# Patient Record
Sex: Male | Born: 1975 | Race: White | Hispanic: No | Smoking: Former smoker
Health system: Southern US, Community
[De-identification: ages and names within clinical notes are randomized; demographics above are authoritative.]

---

## 2002-06-14 ENCOUNTER — Emergency Department (HOSPITAL_COMMUNITY): Admission: EM | Admit: 2002-06-14 | Discharge: 2002-06-14 | Payer: Self-pay | Admitting: Emergency Medicine

## 2005-06-22 ENCOUNTER — Emergency Department (HOSPITAL_COMMUNITY): Admission: EM | Admit: 2005-06-22 | Discharge: 2005-06-23 | Payer: Self-pay | Admitting: Emergency Medicine

## 2006-12-19 ENCOUNTER — Emergency Department (HOSPITAL_COMMUNITY): Admission: EM | Admit: 2006-12-19 | Discharge: 2006-12-19 | Payer: Self-pay | Admitting: Emergency Medicine

## 2007-11-11 ENCOUNTER — Emergency Department (HOSPITAL_COMMUNITY): Admission: EM | Admit: 2007-11-11 | Discharge: 2007-11-11 | Payer: Self-pay | Admitting: Emergency Medicine

## 2011-11-26 ENCOUNTER — Emergency Department (HOSPITAL_COMMUNITY): Payer: BC Managed Care – PPO

## 2011-11-26 ENCOUNTER — Other Ambulatory Visit: Payer: Self-pay

## 2011-11-26 ENCOUNTER — Emergency Department (HOSPITAL_COMMUNITY)
Admission: EM | Admit: 2011-11-26 | Discharge: 2011-11-26 | Disposition: A | Discharge status: Home / Self Care | Payer: BC Managed Care – PPO | Attending: Emergency Medicine | Admitting: Emergency Medicine

## 2011-11-26 ENCOUNTER — Encounter (HOSPITAL_COMMUNITY): Payer: Self-pay | Admitting: *Deleted

## 2011-11-26 DIAGNOSIS — K859 Acute pancreatitis without necrosis or infection, unspecified: Secondary | ICD-10-CM | POA: Insufficient documentation

## 2011-11-26 DIAGNOSIS — R109 Unspecified abdominal pain: Secondary | ICD-10-CM | POA: Insufficient documentation

## 2011-11-26 DIAGNOSIS — Z79899 Other long term (current) drug therapy: Secondary | ICD-10-CM | POA: Insufficient documentation

## 2011-11-26 DIAGNOSIS — R10819 Abdominal tenderness, unspecified site: Secondary | ICD-10-CM | POA: Insufficient documentation

## 2011-11-26 DIAGNOSIS — R079 Chest pain, unspecified: Secondary | ICD-10-CM | POA: Insufficient documentation

## 2011-11-26 LAB — COMPREHENSIVE METABOLIC PANEL
ALT: 26 U/L (ref 0–53)
Alkaline Phosphatase: 77 U/L (ref 39–117)
CO2: 25 mEq/L (ref 19–32)
GFR calc Af Amer: >90 mL/min (ref >90)
GFR calc non Af Amer: >90 mL/min (ref >90)
Glucose, Bld: 69 mg/dL — ABNORMAL LOW (ref 70–99)
Potassium: 4 mEq/L (ref 3.5–5.1)
Sodium: 138 mEq/L (ref 135–145)

## 2011-11-26 LAB — DIFFERENTIAL
Lymphocytes Relative: 34 % (ref 12–46)
Lymphs Abs: 2.9 10*3/uL (ref 0.7–4.0)
Neutrophils Relative %: 54 % (ref 43–77)

## 2011-11-26 LAB — CBC
MCV: 81.9 fL (ref 78.0–100.0)
Platelets: 261 10*3/uL (ref 150–400)
RBC: 5.64 MIL/uL (ref 4.22–5.81)
WBC: 8.5 10*3/uL (ref 4.0–10.5)

## 2011-11-26 IMAGING — CT CT ABD-PELV W/ CM
2 of 4 series · 17 of 46 positions shown, 19 images · IV contrast (APPLIED)
Comparison: None.

CLINICAL DATA: Epigastric pain for 2 days

CT ABDOMEN AND PELVIS WITH CONTRAST
TECHNIQUE: Multidetector CT imaging of the abdomen and pelvis was
performed following the standard protocol during bolus
administration of intravenous contrast.
Contrast: 100mL OMNIPAQUE IOHEXOL 300 MG/ML  SOLN

[Series 2: abd/pelv with 5.0 b31f st · axial · 0.70mm/px · z∈[-512,-6]mm · 14 of 111 slices shown, 16 images]
[im 5/111  soft-tissue]
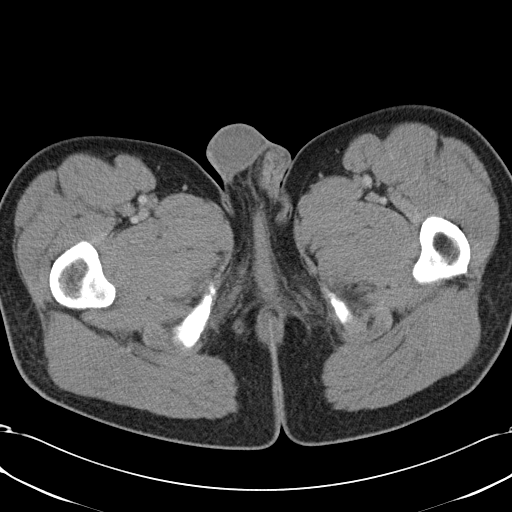
[im 5/111  bone]
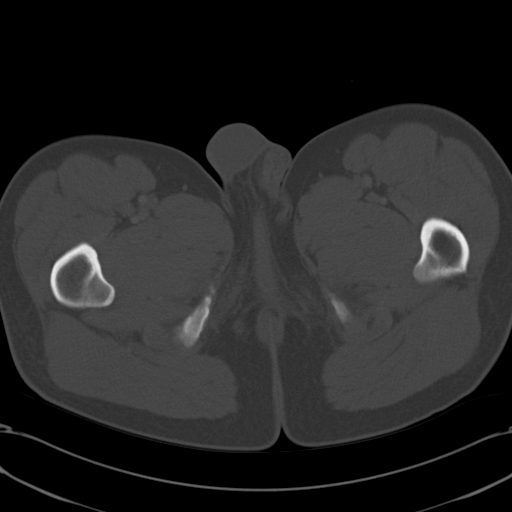
[im 13/111  soft-tissue]
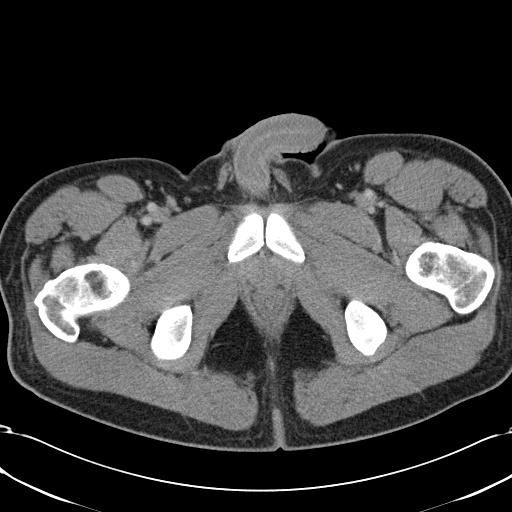
[im 22/111  soft-tissue]
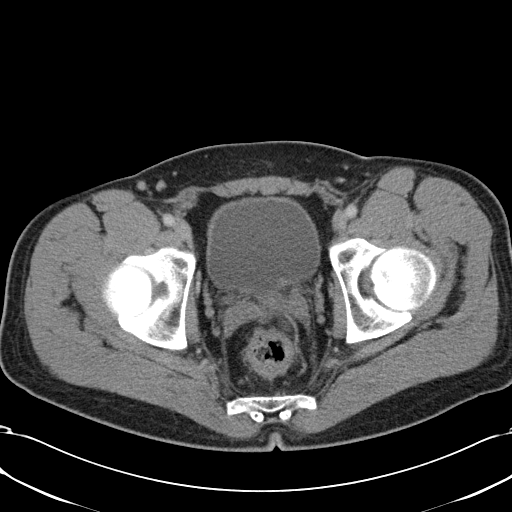
[im 30/111  soft-tissue]
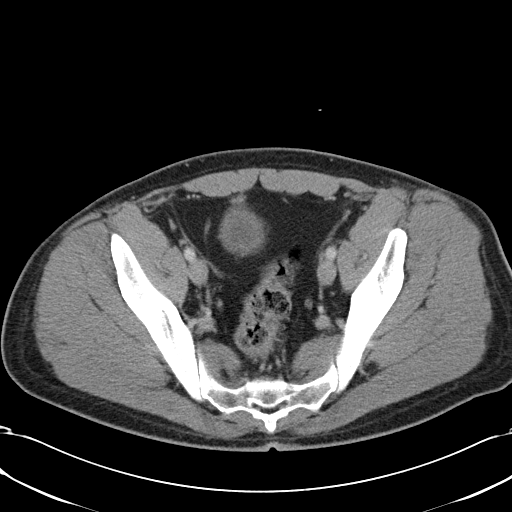
[im 39/111  soft-tissue]
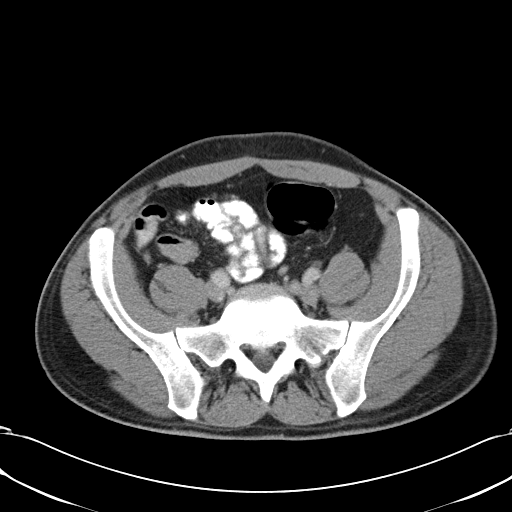
[im 43/111  soft-tissue]
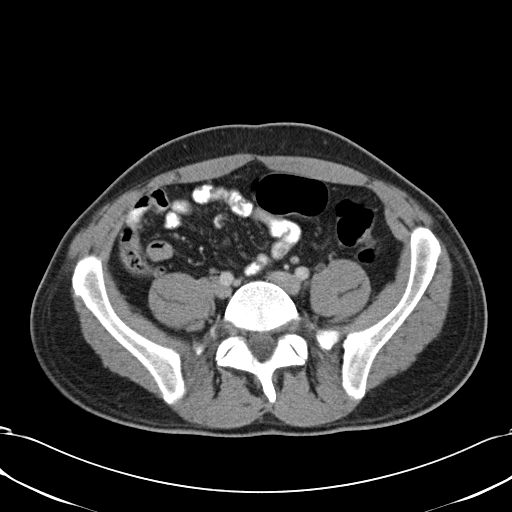
[im 51/111  soft-tissue]
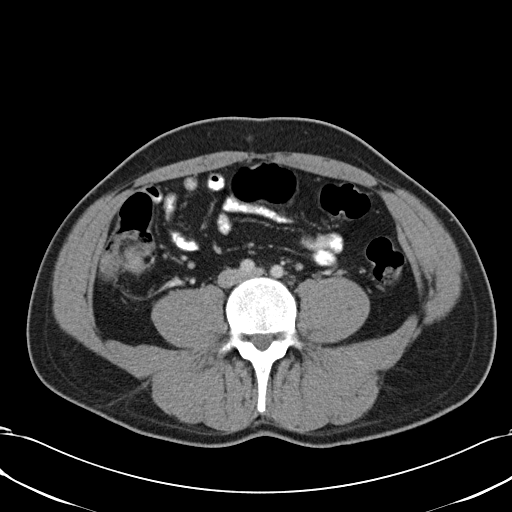
[im 60/111  soft-tissue]
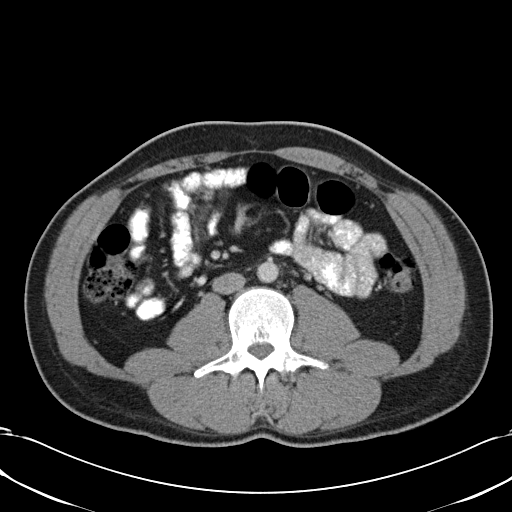
[im 68/111  soft-tissue]
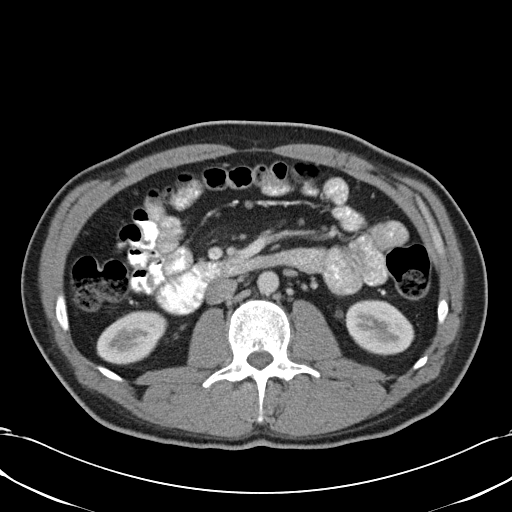
[im 68/111  bone]
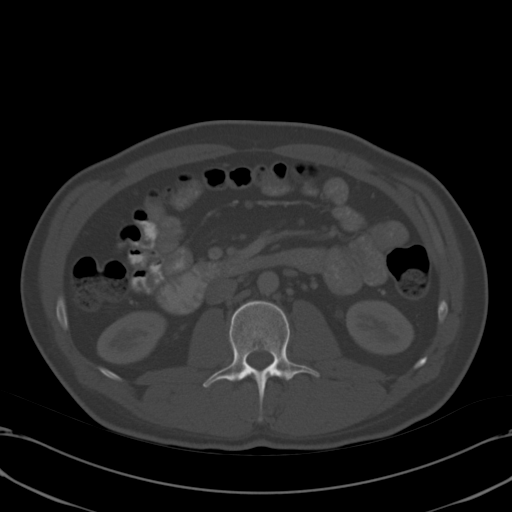
[im 72/111  soft-tissue]
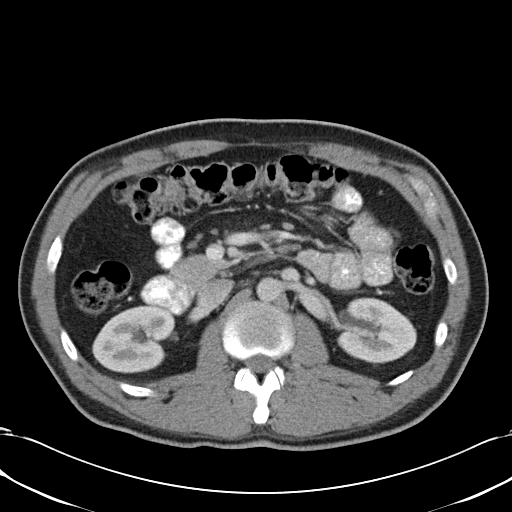
[im 81/111  soft-tissue]
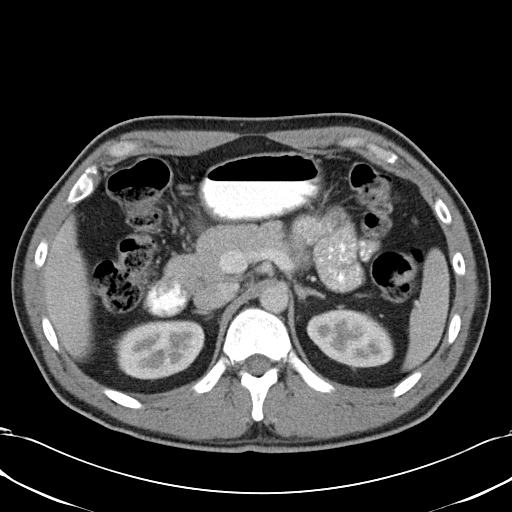
[im 89/111  soft-tissue]
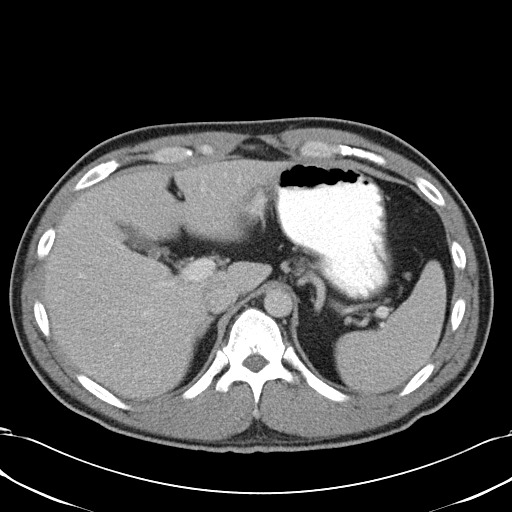
[im 98/111  soft-tissue]
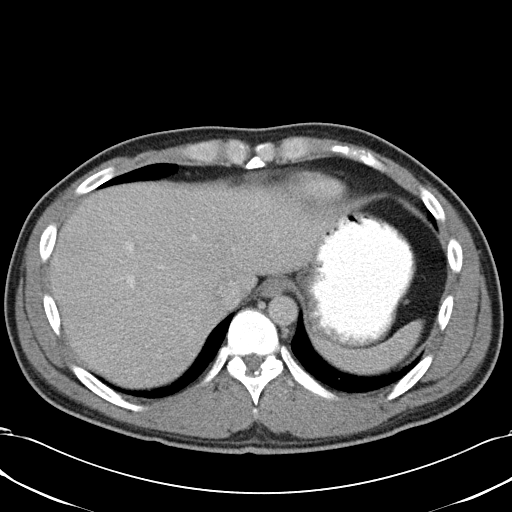
[im 106/111  soft-tissue]
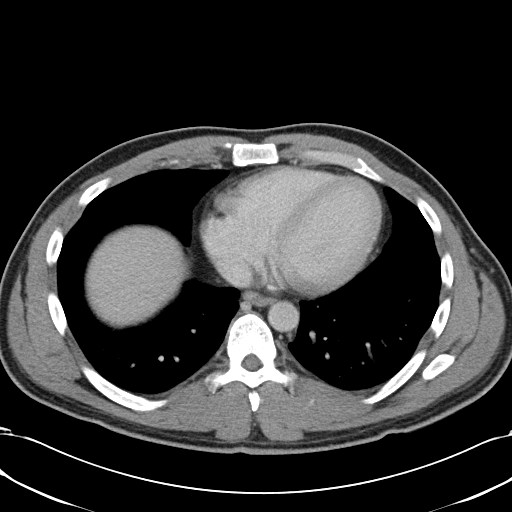

[Series 602: coronal abd/pel · coronal · 1.08mm/px · 3 of 76 slices shown]
[im 26/76  soft-tissue]
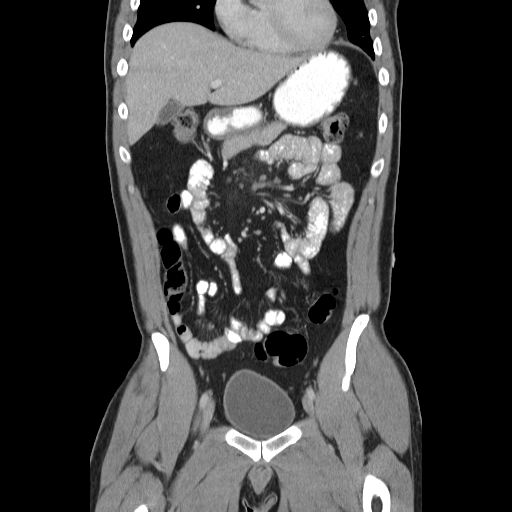
[im 34/76  soft-tissue]
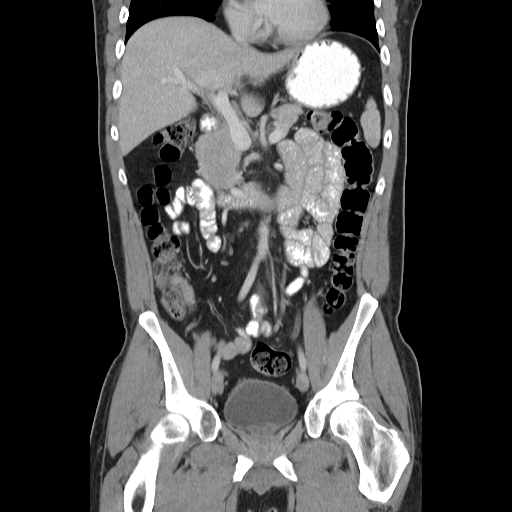
[im 42/76  soft-tissue]
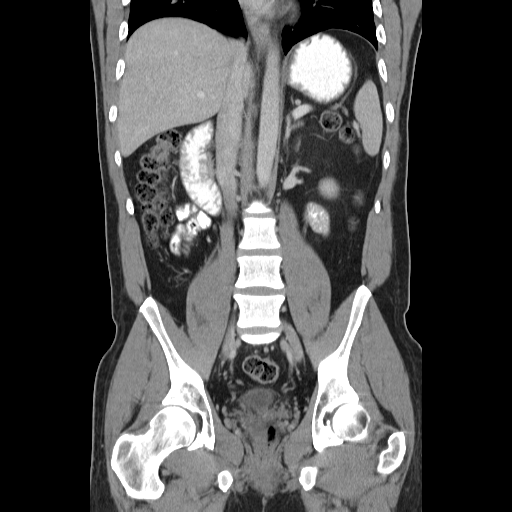

[17 of 46 positions shown; findings below may reference images not displayed]

FINDINGS: Lung bases are unremarkable.  Sagittal images of the
spine are unremarkable.

Enhanced liver, spleen, pancreas and adrenal glands are
unremarkable.  There is no evidence of pancreatitis.

No intrahepatic biliary ductal dilatation.  No calcified gallstones
are identified within gallbladder.

Enhanced kidneys are symmetrical in size.  No hydronephrosis or
hydroureter.  No focal renal mass.

The oral contrast material was given to the patient.  No small
bowel or colonic obstruction.  No ascites or free air.  No
adenopathy.

Bilateral visualized ureter is unremarkable.  The urinary bladder
is unremarkable.

There is no pericecal inflammation.  Normal appendix is clearly
visualized axial image 70.

Prostate gland seminal vesicles are unremarkable.  No pelvic
ascites or adenopathy.  No inguinal adenopathy.  No destructive
bony lesions are noted within pelvis.
IMPRESSION: 1.  No acute inflammatory process within abdomen or pelvis.
2.  No evidence of pancreatitis.
3.  No small bowel obstruction.  No hydronephrosis or hydroureter.
4.  Normal appendix is clearly visualized.

## 2011-11-26 MED ORDER — IOHEXOL 300 MG/ML  SOLN
100.0000 mL | Freq: Once | INTRAMUSCULAR | Status: AC | PRN
  Administered 2011-11-26: 100 mL via INTRAVENOUS

## 2011-11-26 MED ORDER — PROMETHAZINE HCL 25 MG PO TABS: 25.0000 mg | ORAL_TABLET | Freq: Four times a day (QID) | ORAL | Status: AC | PRN

## 2011-11-26 MED ORDER — IOHEXOL 300 MG/ML  SOLN
20.0000 mL | INTRAMUSCULAR | Status: AC
  Administered 2011-11-26: 20 mL via ORAL

## 2011-11-26 MED ORDER — PANTOPRAZOLE SODIUM 40 MG IV SOLR
40.0000 mg | Freq: Once | INTRAVENOUS | Status: AC
  Administered 2011-11-26: 40 mg via INTRAVENOUS
  Filled 2011-11-26: qty 40

## 2011-11-26 MED ORDER — TRAMADOL HCL 50 MG PO TABS: 50.0000 mg | ORAL_TABLET | Freq: Four times a day (QID) | ORAL | Status: AC | PRN

## 2011-11-26 NOTE — ED Notes (Signed)
Patient transported to CT 

## 2011-11-26 NOTE — ED Provider Notes (Signed)
History     CSN: [id number]  Arrival date & time 11/26/11  0919   First MD Initiated Contact with Patient 11/26/11 1021      Chief Complaint  Patient presents with  . Chest Pain    (Consider location/radiation/quality/duration/timing/severity/associated sxs/prior treatment) Patient is a 36 y.o. male presenting with chest pain. The history is provided by the patient (Patient complains of periodic epigastric pain and chest pain no nausea no vomiting no sweating.). No language interpreter was used.  Chest Pain The chest pain began 2 days ago. Chest pain occurs frequently. The chest pain is unchanged. Associated with: nothing. At its most intense, the pain is at 4/10. The pain is currently at 1/10. The quality of the pain is described as aching. The pain radiates to the epigastrium. Exacerbated by: nothing. Pertinent negatives for primary symptoms include no fever, no fatigue, no cough and no abdominal pain.  Pertinent negatives for associated symptoms include no claudication. He tried nothing for the symptoms. Risk factors include no known risk factors.  Pertinent negatives for past medical history include no aneurysm and no seizures.     History reviewed. No pertinent past medical history.  History reviewed. No pertinent past surgical history.  History reviewed. No pertinent family history.  History  Substance Use Topics  . Smoking status: Former Games developer  . Smokeless tobacco: Not on file  . Alcohol Use: Yes     sometimes      Review of Systems  Constitutional: Negative for fever and fatigue.  HENT: Negative for congestion, sinus pressure and ear discharge.   Eyes: Negative for discharge.  Respiratory: Negative for cough.   Cardiovascular: Positive for chest pain. Negative for claudication.  Gastrointestinal: Negative for abdominal pain and diarrhea.  Genitourinary: Negative for frequency and hematuria.  Musculoskeletal: Negative for back pain.  Skin: Negative for rash.    Neurological: Negative for seizures and headaches.  Hematological: Negative.   Psychiatric/Behavioral: Negative for hallucinations.    Allergies  Review of patient's allergies indicates no known allergies.  Home Medications   Current Outpatient Rx  Name Route Sig Dispense Refill  . PROMETHAZINE HCL 25 MG PO TABS Oral Take 1 tablet (25 mg total) by mouth every 6 (six) hours as needed for nausea. 15 tablet 0  . TRAMADOL HCL 50 MG PO TABS Oral Take 1 tablet (50 mg total) by mouth every 6 (six) hours as needed for pain. 20 tablet 0    BP 125/87  Pulse 76  Temp(Src) 98.2 F (36.8 C) (Oral)  Resp 17  SpO2 99%  Physical Exam  Constitutional: He is oriented to person, place, and time. He appears well-developed.  HENT:  Head: Normocephalic and atraumatic.  Eyes: Conjunctivae and EOM are normal. No scleral icterus.  Neck: Neck supple. No thyromegaly present.  Cardiovascular: Normal rate and regular rhythm.  Exam reveals no gallop and no friction rub.   No murmur heard. Pulmonary/Chest: No stridor. He has no wheezes. He has no rales. He exhibits no tenderness.  Abdominal: He exhibits no distension. There is tenderness. There is no rebound.  Musculoskeletal: Normal range of motion. He exhibits no edema.  Lymphadenopathy:    He has no cervical adenopathy.  Neurological: He is oriented to person, place, and time. Coordination normal.  Skin: No rash noted. No erythema.  Psychiatric: He has a normal mood and affect. His behavior is normal.    ED Course  Procedures (including critical care time)  Labs Reviewed  COMPREHENSIVE METABOLIC PANEL - Abnormal;  Notable for the following:    Glucose, Bld 69 (*)    All other components within normal limits  LIPASE, BLOOD - Abnormal; Notable for the following:    Lipase 174 (*)    All other components within normal limits  CBC  DIFFERENTIAL  TROPONIN I   Ct Abdomen Pelvis W Contrast  11/26/2011  *RADIOLOGY REPORT*  Clinical Data:  Epigastric pain for 2 days  CT ABDOMEN AND PELVIS WITH CONTRAST  Technique:  Multidetector CT imaging of the abdomen and pelvis was performed following the standard protocol during bolus administration of intravenous contrast.  Contrast: OMNIPAQUE IOHEXOL 300 MG/ML  SOLN  Comparison: None.  Findings: Lung bases are unremarkable.  Sagittal images of the spine are unremarkable.  Enhanced liver, spleen, pancreas and adrenal glands are unremarkable.  There is no evidence of pancreatitis.  No intrahepatic biliary ductal dilatation.  No calcified gallstones are identified within gallbladder.  Enhanced kidneys are symmetrical in size.  No hydronephrosis or hydroureter.  No focal renal mass.  The oral contrast material was given to the patient.  No small bowel or colonic obstruction.  No ascites or free air.  No adenopathy.  Bilateral visualized ureter is unremarkable.  The urinary bladder is unremarkable.  There is no pericecal inflammation.  Normal appendix is clearly visualized axial image 70.  Prostate gland seminal vesicles are unremarkable.  No pelvic ascites or adenopathy.  No inguinal adenopathy.  No destructive bony lesions are noted within pelvis.  IMPRESSION:  1.  No acute inflammatory process within abdomen or pelvis. 2.  No evidence of pancreatitis. 3.  No small bowel obstruction.  No hydronephrosis or hydroureter. 4.  Normal appendix is clearly visualized.  Original Report Authenticated By: Natasha Mead, M.D.   Dg Abd Acute W/chest  11/26/2011  *RADIOLOGY REPORT*  Clinical Data: 36 year old male with chest pain, abdominal pain.  ACUTE ABDOMEN SERIES (ABDOMEN 2 VIEW & CHEST 1 VIEW)  Comparison: None.  Findings: Normal lung volumes. Normal cardiac size and mediastinal contours.  Visualized tracheal air column is within normal limits. The lungs are clear.  Incidental EKG button artifact projects over the left upper lung.  No pneumothorax, pneumoperitoneum, or effusion.  Nonobstructed bowel gas pattern.   Abdominal and pelvic visceral contours are within normal limits. No osseous abnormality identified.  IMPRESSION: Nonobstructed bowel gas pattern, no free air.  Negative chest.  Original Report Authenticated By: Harley Hallmark, M.D.     1. Pancreatitis       Date: 11/26/2011  Rate: 79  Rhythm: sinus arrhythmia  QRS Axis: normal  Intervals: normal  ST/T Wave abnormalities: normal  Conduction Disutrbances:none  Narrative Interpretation:   Old EKG Reviewed: none available   MDM  Pancreatitis,        Benny Lennert, MD 11/26/11 1450

## 2011-11-26 NOTE — ED Notes (Signed)
To ed for eval of abd pain yesterday that radiates to epigastric/midsternal area. Pt states he thought this was indigestion but has continued. Skin w/d, resp e/u. Denies n/v/d.

## 2011-11-26 NOTE — Discharge Instructions (Signed)
Follow up with Dr. Loreta Ave or Dr. Elnoria Howard in 1 -2 weeksAcute Pancreatitis The pancreas is a large gland located behind your stomach. It produces (secretes) enzymes. These enzymes help digest food. It also releases the hormones glucagon and insulin. These hormones help regulate blood sugar. When the pancreas becomes inflamed, the disease is called pancreatitis. Inflammation of the pancreas occurs when enzymes from the pancreas begin attacking and digesting the pancreas. CAUSES  Most cases ofsudden onset (acute) pancreatitis are caused by:  Alcohol abuse.   Gallstones.  Other less common causes are:  Some medications.   Exposure to certain chemicals   Infection.   Damage caused by an accident (trauma).   Surgery of the belly (abdomen).  SYMPTOMS  Acute pancreatitis usually begins with pain in the upper abdomen and may radiate to the back. This pain may last a couple days. The constant pain varies from mild to severe. The acute form of this disease may vary from mild, nonspecific abdominal pain to profound shock with coma. About 1 in 5 cases are severe. These patients become dehydrated and develop low blood pressure. In severe cases, bleeding into the pancreas can lead to shock and death. The lungs, heart, and kidneys may fail. DIAGNOSIS  Your caregiver will form a clinical opinion after giving you an exam. Laboratory work is used to confirm this diagnosis. Often,a digestive enzyme from the pancreas (serum amylase) and other enzymes are elevated. Sugars and fats (lipids) in the blood may be elevated. There may also be changes in the following levels: calcium, magnesium, potassium, chloride and bicarbonate (chemicals in the blood). X-rays, a CT scan, or ultrasound of your abdomen may be necessary to search for other causes of your abdominal pain. TREATMENT  Most pancreatitis requires treatment of symptoms. Most acute attacks last a couple of days. Your caregiver can discuss the treatment options with  you.  If complications occur, hospitalization may be necessary for pain control and intravenous (IV) fluid replacement.   Sometimes, a tube may be put into the stomach to control vomiting.   Food may not be allowed for 3 to 4 days. This gives the pancreas time to rest. Giving the pancreas a rest means there is no stimulation that would produce more enzymes and cause more damage.   Medicines (antibiotics) that kill germs may be given if infection is the cause.   Sometimes, surgery may be required.   Following an acute attack, your caregiver will determine the cause, if possible, and offer suggestions to prevent recurrences.  HOME CARE INSTRUCTIONS   Eat smaller, more frequent meals. This reduces the amount of digestive juices the pancreas produces.   Decrease the amount of fat in your diet. This may help reduce loose, diarrheal stools.   Drink enough water and fluids to keep your urine clear or pale yellow. This is to avoid dehydration which can cause increased pain.   Talk to your caregiver about pain relievers or other medicines that may help.   Avoid anything that may have triggered your pancreatitis (for example, alcohol).   Follow the diet advised by your caregiver. Do not advance the diet too soon.   Take medicines as prescribed.   Get plenty of rest.   Check your blood sugar at home as directed by your caregiver.   If your caregiver has given you a follow-up appointment, it is very important to keep that appointment. Not keeping the appointment could result in a lasting (chronic) or permanent injury, pain, and disability. If there  is any problem keeping the appointment, you must call to reschedule.  SEEK MEDICAL CARE IF:   You are not recovering in the time described by your caregiver.   You have persistent pain, weakness, or feel sick to your stomach (nauseous).   You have recovered and then have another bout of pain.  SEEK IMMEDIATE MEDICAL CARE IF:   You are unable  to eat or keep fluids down.   Your pain increases a lot or changes.   You have an oral temperature above 102 F (38.9 C), not controlled by medicine.   Your skin or the white part of your eyes look yellow (jaundice).   You develop vomiting.   You feel dizzy or faint.   Your blood sugar is high (over 300).  MAKE SURE YOU:   Understand these instructions.   Will watch your condition.   Will get help right away if you are not doing well or get worse.  Document Released: 08/08/2005 Document Revised: 07/28/2011 Document Reviewed: 03/21/2008 [facility] Patient Information 2012 [place], [place].

## 2011-11-26 NOTE — ED Notes (Signed)
Per CT tech, pt is refusing transport via stretcher to CT, pt requested to walk. RN notified

## 2011-11-26 NOTE — ED Notes (Signed)
Pt ambulatory at discharge.  

## 2018-09-24 ENCOUNTER — Ambulatory Visit (HOSPITAL_COMMUNITY)
Admission: EM | Admit: 2018-09-24 | Discharge: 2018-09-24 | Disposition: A | Discharge status: Home / Self Care | Payer: 59 | Attending: Family Medicine | Admitting: Family Medicine

## 2018-09-24 ENCOUNTER — Other Ambulatory Visit: Payer: Self-pay

## 2018-09-24 ENCOUNTER — Encounter (HOSPITAL_COMMUNITY): Payer: Self-pay

## 2018-09-24 DIAGNOSIS — R1013 Epigastric pain: Secondary | ICD-10-CM

## 2018-09-24 MED ORDER — SUCRALFATE 1 G PO TABS: 1.0000 g | ORAL_TABLET | Freq: Three times a day (TID) | ORAL | 1 refills | Status: AC

## 2018-09-24 MED ORDER — ESOMEPRAZOLE MAGNESIUM 40 MG PO CPDR: 40.0000 mg | DELAYED_RELEASE_CAPSULE | Freq: Every day | ORAL | 2 refills | Status: AC

## 2018-09-24 NOTE — ED Triage Notes (Signed)
Pt cc stomach pain upper abdominal. Pt states she has tried tums and it relives it for a a while.

## 2018-09-24 NOTE — Discharge Instructions (Addendum)
You have been seen today for abdominal pain. Your evaluation was not suggestive of any emergent condition requiring medical intervention at this time. However, some abdominal problems make take more time to appear. Therefore, it's very important for you to pay attention to any new symptoms or worsening of your current condition. ° °Please return here or to the Emergency Department immediately should you feel worse in any way or have any of the following symptoms: increasing or different abdominal pain, persistent vomiting, fevers, or shaking chills. °

## 2018-09-27 NOTE — ED Provider Notes (Signed)
[facility]   [id number] 09/24/18 Arrival Time: 1308  ASSESSMENT & PLAN:  1. Epigastric pain    Discussed dyspepsia and abdominal pain. Question if he could have an ulcer. Benign abdominal exam today. No need for urgent CT or endoscopy. Discussed.  Declines baseline labs including CBC, CMP, amylase, lipase. Prefers to have these drawn by PCP.  Trial of: Meds ordered this encounter  Medications  . sucralfate (CARAFATE) 1 g tablet    Sig: Take 1 tablet (1 g total) by mouth 4 (four) times daily -  with meals and at bedtime.    Dispense:  30 tablet    Refill:  1  . esomeprazole (NEXIUM) 40 MG capsule    Sig: Take 1 capsule (40 mg total) by mouth daily at 12 noon.    Dispense:  30 capsule    Refill:  2     Discharge Instructions     You have been seen today for abdominal pain. Your evaluation was not suggestive of any emergent condition requiring medical intervention at this time. However, some abdominal problems make take more time to appear. Therefore, it's very important for you to pay attention to any new symptoms or worsening of your current condition.  Please return here or to the Emergency Department immediately should you feel worse in any way or have any of the following symptoms: increasing or different abdominal pain, persistent vomiting, fevers, or shaking chills.   Follow-up Information    Schedule an appointment as soon as possible for a visit  with [facility] at [facility].          Reviewed expectations re: course of current medical issues. Questions answered. Outlined signs and symptoms indicating need for more acute intervention. Patient verbalized understanding. After Visit Summary given.   SUBJECTIVE: History from: patient. Jon Nichols is a 43 y.o. male who presents with complaint of intermittent epigastric abdominal discomfort. Reports this recurs about every 1.5 months for the past 7 years. Usually lasts an hour or so then  resolves. Went to ED in 2013 with negative workup. This episode with abrupt onset a couple of days ago and lasted 4-5 hours; last felt yesterday. None today. Discomfort described as stabbing; without radiation. Fever: absent. Aggravating factors: possibly related to PO intake but he is not completely convinced of this. Alleviating factors: have not been identified; TUMS helps a little. Associated symptoms: belching. He denies arthralgias, constipation, diarrhea, dysuria, nausea, sweats and vomiting. Appetite: normal. PO intake: normal. Ambulatory without assistance. Urinary symptoms: none. Bowel movements: have not significantly changed; last bowel movement within the past 1-2 days without blood.  History reviewed. No pertinent surgical history.  ROS: As per HPI. All other systems negative.  OBJECTIVE:  Vitals:   09/24/18 1338 09/24/18 1343  BP:  (!) 130/92  Pulse:  100  Resp:  18  Temp:  98.7 F (37.1 C)  SpO2:  100%  Weight: 90.7 kg     General appearance: alert, oriented, no acute distress Lungs: clear to auscultation bilaterally; unlabored respirations Heart: regular rate and rhythm Abdomen: soft; without distention; no tenderness; normal bowel sounds; without masses or organomegaly; without guarding or rebound tenderness Back: without CVA tenderness; FROM at waist Extremities: without LE edema; symmetrical; without gross deformities Skin: warm and dry Neurologic: normal  gait Psychological: alert and cooperative; normal mood and affect  No Known Allergies  PMH: As in HPI.  Social History   Socioeconomic History  . Marital status: Divorced    Spouse name: Not on file  . Number of children: Not on file  . Years of education: Not on file  . Highest education level: Not on file  Occupational History  . Not on file  Social Needs  . Financial resource strain: Not on file  . Food insecurity:    Worry: Not on file    Inability: Not  on file  . Transportation needs:    Medical: Not on file    Non-medical: Not on file  Tobacco Use  . Smoking status: Former Games developer  . Smokeless tobacco: Never Used  Substance and Sexual Activity  . Alcohol use: Yes    Comment: sometimes  . Drug use: No  . Sexual activity: Not on file  Lifestyle  . Physical activity:    Days per week: Not on file    Minutes per session: Not on file  . Stress: Not on file  Relationships  . Social connections:    Talks on phone: Not on file    Gets together: Not on file    Attends religious service: Not on file    Active member of club or organization: Not on file    Attends meetings of clubs or organizations: Not on file    Relationship status: Not on file  . Intimate partner violence:    Fear of current or ex partner: Not on file    Emotionally abused: Not on file    Physically abused: Not on file    Forced sexual activity: Not on file  Other Topics Concern  . Not on file  Social History Narrative  . Not on file   FH: No colon disease.   Mardella Layman, MD 09/27/18 [phone]

## 2018-10-04 ENCOUNTER — Other Ambulatory Visit: Payer: Self-pay | Admitting: Gastroenterology

## 2018-10-04 DIAGNOSIS — R1013 Epigastric pain: Secondary | ICD-10-CM

## 2018-10-10 ENCOUNTER — Ambulatory Visit
Admission: RE | Admit: 2018-10-10 | Discharge: 2018-10-10 | Disposition: A | Discharge status: Home / Self Care | Payer: 59 | Source: Ambulatory Visit | Attending: Gastroenterology | Admitting: Gastroenterology

## 2018-10-10 DIAGNOSIS — R1013 Epigastric pain: Secondary | ICD-10-CM

## 2018-10-10 MED ORDER — IOPAMIDOL (ISOVUE-300) INJECTION 61%
100.0000 mL | Freq: Once | INTRAVENOUS | Status: AC | PRN
  Administered 2018-10-10: 100 mL via INTRAVENOUS

## 2019-06-25 ENCOUNTER — Other Ambulatory Visit: Payer: Self-pay

## 2019-06-25 DIAGNOSIS — Z20822 Contact with and (suspected) exposure to covid-19: Secondary | ICD-10-CM

## 2019-06-27 LAB — NOVEL CORONAVIRUS, NAA: SARS-CoV-2, NAA: NOT DETECTED
